# Patient Record
Sex: Male | Born: 1985 | Hispanic: Yes | Marital: Single | Smoking: Current every day smoker
Health system: Southern US, Community
[De-identification: ages and names within clinical notes are randomized; demographics above are authoritative.]

---

## 2010-06-17 ENCOUNTER — Emergency Department (HOSPITAL_COMMUNITY): Admission: EM | Admit: 2010-06-17 | Discharge: 2010-06-17 | Payer: Self-pay | Admitting: Emergency Medicine

## 2012-02-04 ENCOUNTER — Encounter (HOSPITAL_COMMUNITY): Payer: Self-pay | Admitting: Emergency Medicine

## 2012-02-04 ENCOUNTER — Emergency Department (HOSPITAL_COMMUNITY)
Admission: EM | Admit: 2012-02-04 | Discharge: 2012-02-04 | Disposition: A | Discharge status: Home / Self Care | Payer: Self-pay | Attending: Emergency Medicine | Admitting: Emergency Medicine

## 2012-02-04 DIAGNOSIS — H938X9 Other specified disorders of ear, unspecified ear: Secondary | ICD-10-CM | POA: Insufficient documentation

## 2012-02-04 DIAGNOSIS — H9190 Unspecified hearing loss, unspecified ear: Secondary | ICD-10-CM | POA: Insufficient documentation

## 2012-02-04 DIAGNOSIS — H9209 Otalgia, unspecified ear: Secondary | ICD-10-CM | POA: Insufficient documentation

## 2012-02-04 DIAGNOSIS — H9210 Otorrhea, unspecified ear: Secondary | ICD-10-CM | POA: Insufficient documentation

## 2012-02-04 DIAGNOSIS — H6090 Unspecified otitis externa, unspecified ear: Secondary | ICD-10-CM

## 2012-02-04 DIAGNOSIS — H60399 Other infective otitis externa, unspecified ear: Secondary | ICD-10-CM | POA: Insufficient documentation

## 2012-02-04 MED ORDER — NEOMYCIN-POLYMYXIN-HC 3.5-10000-1 OT SUSP
4.0000 [drp] | Freq: Four times a day (QID) | OTIC | Status: DC
  Administered 2012-02-04: 4 [drp] via OTIC
  Filled 2012-02-04: qty 10

## 2012-02-04 NOTE — ED Provider Notes (Signed)
History     CSN: [id number]  Arrival date & time 02/04/12  1478   First MD Initiated Contact with Patient 02/04/12 [phone]      Chief Complaint  Patient presents with  . Ear pain     (Consider location/radiation/quality/duration/timing/severity/associated sxs/prior treatment) HPI Level V caveat: Language barrier. This is a 26 year old Hispanic male with a history of pain in the left ear since yesterday. The pain is moderate to severe, worse with movement of the ear. There is some drainage from the ear. There's some decreased hearing acuity in that ear. He has a history of ear infections as a child but none as an adult.  History reviewed. No pertinent past medical history.  History reviewed. No pertinent past surgical history.  No family history on file.  History  Substance Use Topics  . Smoking status: Current Everyday Smoker    Types: Cigarettes  . Smokeless tobacco: Not on file  . Alcohol Use: No      Review of Systems  Unable to perform ROS   Allergies  Review of patient's allergies indicates no known allergies.  Home Medications  No current outpatient prescriptions on file.  BP 128/79  Pulse 72  Temp(Src) 97.7 F (36.5 C) (Oral)  Resp 16  SpO2 98%  Physical Exam General: Well-developed, well-nourished male in no acute distress; appearance consistent with age of record HENT: normocephalic, atraumatic; no erythema tympanic membranes; edema and exudate in the left external auditory canal; pain on movement of left external ear Eyes: pupils equal round and reactive to light; extraocular muscles intact Neck: supple Heart: regular rate and rhythm Lungs: Normal respiratory effort and excursion Abdomen: soft; nondistended Extremities: No deformity; full range of motion Neurologic: Awake, alert; motor function intact in all extremities and symmetric; no facial droop Skin: Warm and dry    ED Course  Procedures (including critical care time)    MDM           Hanley Seamen, MD 02/04/12 [phone]

## 2012-02-04 NOTE — ED Notes (Signed)
Patient with ear pain on the left since yesterday.

## 2013-11-11 ENCOUNTER — Emergency Department (HOSPITAL_COMMUNITY)
Admission: EM | Admit: 2013-11-11 | Discharge: 2013-11-11 | Disposition: A | Discharge status: Home / Self Care | Payer: Self-pay | Attending: Emergency Medicine | Admitting: Emergency Medicine

## 2013-11-11 ENCOUNTER — Encounter (HOSPITAL_COMMUNITY): Payer: Self-pay | Admitting: Emergency Medicine

## 2013-11-11 DIAGNOSIS — J111 Influenza due to unidentified influenza virus with other respiratory manifestations: Secondary | ICD-10-CM | POA: Insufficient documentation

## 2013-11-11 DIAGNOSIS — R630 Anorexia: Secondary | ICD-10-CM | POA: Insufficient documentation

## 2013-11-11 DIAGNOSIS — R61 Generalized hyperhidrosis: Secondary | ICD-10-CM | POA: Insufficient documentation

## 2013-11-11 DIAGNOSIS — F17200 Nicotine dependence, unspecified, uncomplicated: Secondary | ICD-10-CM | POA: Insufficient documentation

## 2013-11-11 MED ORDER — ACETAMINOPHEN 325 MG PO TABS
650.0000 mg | ORAL_TABLET | Freq: Once | ORAL | Status: AC
  Administered 2013-11-11: 650 mg via ORAL
  Filled 2013-11-11: qty 2

## 2013-11-11 MED ORDER — FLUTICASONE PROPIONATE 50 MCG/ACT NA SUSP: 1.0000 | Freq: Every day | NASAL | Status: AC

## 2013-11-11 NOTE — ED Provider Notes (Signed)
CSN: [id number]     Arrival date & time 11/11/13  1359 History  This chart was scribed for non-physician practitioner Dierdre Forth, PA-C working with Junius Argyle, MD by Joaquin Music, ED Scribe. This patient was seen in room TR09C/TR09C and the patient's care was started at 4:02 PM .   Chief Complaint  Patient presents with  . URI  . Generalized Body Aches   The history is provided by the patient and medical records. No language interpreter was used.   HPI Comments: Larry Camacho is a 27 y.o. male who presents to the Emergency Department complaining of ongoing fever, chills, myalgias and fatigue for the past 2 days. Pt states he when he woke up 2 days ago, he had myalgias and was diaphoretic. Pt states he felt "like he was hit by a bus". Pt states he is aching and in pain throughout his body. He states he has been "very tired" and has a tendency to take frequent naps. He states when he wakes up, his clothes is soaking wet from sweat and states he has to take a shower. Pt states he has had a decreased appetite and states he has only been wanting to drink water. Pt denies HA. Pt denies sore throat, congestion, otalgia, nausea, emesis, and diarrhea.   History reviewed. No pertinent past medical history. History reviewed. No pertinent past surgical history. History reviewed. No pertinent family history. History  Substance Use Topics  . Smoking status: Current Every Day Smoker    Types: Cigarettes  . Smokeless tobacco: Not on file  . Alcohol Use: No    Review of Systems  Constitutional: Positive for fever, chills, appetite change and fatigue. Negative for diaphoresis.  HENT: Positive for congestion and sinus pressure. Negative for ear pain, rhinorrhea, sneezing and sore throat.   Gastrointestinal: Negative for nausea, vomiting and diarrhea.  Musculoskeletal: Positive for myalgias.    Allergies  Review of patient's allergies indicates no known  allergies.  Home Medications  No current outpatient prescriptions on file.  BP 129/77  Temp(Src) 98.2 F (36.8 C) (Oral)  Resp 16  Ht 5\' 5"  (1.651 m)  Wt 147 lb 8 oz (66.906 kg)  BMI 24.55 kg/m2  SpO2 98%  Physical Exam  Nursing note and vitals reviewed. Constitutional: He is oriented to person, place, and time. He appears well-developed and well-nourished. No distress.  Awake, alert, nontoxic appearance  HENT:  Head: Normocephalic and atraumatic.  Right Ear: Tympanic membrane, external ear and ear canal normal.  Left Ear: Tympanic membrane, external ear and ear canal normal.  Nose: Mucosal edema and rhinorrhea present. No epistaxis. Right sinus exhibits no maxillary sinus tenderness and no frontal sinus tenderness. Left sinus exhibits no maxillary sinus tenderness and no frontal sinus tenderness.  Mouth/Throat: Uvula is midline, oropharynx is clear and moist and mucous membranes are normal. Mucous membranes are not pale and not cyanotic. No oropharyngeal exudate, posterior oropharyngeal edema, posterior oropharyngeal erythema or tonsillar abscesses.  Mucous membranes moist  Eyes: Conjunctivae and EOM are normal. Pupils are equal, round, and reactive to light. No scleral icterus.  Neck: Normal range of motion and full passive range of motion without pain. Neck supple. No tracheal deviation present.  Cardiovascular: Normal rate, regular rhythm, normal heart sounds and intact distal pulses.   No murmur heard. Pulmonary/Chest: Effort normal and breath sounds normal. No stridor. No respiratory distress. He has no wheezes.  Clear and equal breath sounds  Abdominal: Soft. Bowel sounds are normal. He exhibits no  mass. There is no tenderness. There is no rebound and no guarding.  Soft and nontender abdomen  Musculoskeletal: Normal range of motion. He exhibits no edema.  Lymphadenopathy:    He has no cervical adenopathy.  Neurological: He is alert and oriented to person, place, and time.  He exhibits normal muscle tone. Coordination normal.  Speech is clear and goal oriented Moves extremities without ataxia  Skin: Skin is warm and dry. No rash noted. He is not diaphoretic.  Psychiatric: He has a normal mood and affect. His behavior is normal.    ED Course  Procedures  DIAGNOSTIC STUDIES: Oxygen Saturation is 98% on RA, normal by my interpretation.    COORDINATION OF CARE: 4:10 PM-Discussed treatment plan which includes administer Tylenol while in ED and D/C pt with Flonase. Pt agreed to plan.   Labs Review Labs Reviewed - No data to display Imaging Review No results found.  EKG Interpretation   None       MDM   1. Influenza-like illness      Larry Camacho presents with influenza like illness vs URI symptoms.  Pt with myalgias, subjective fevers without otalgia, ST cough or wheezing.  Pt without comorbidities which would put him at risk and stable vital signs.   Patient with symptoms consistent with influenza like illness.  Vitals are stable, low-grade fever.  No signs of dehydration, tolerating PO's.  Lungs are clear. Due to patient's presentation and physical exam a chest x-ray was not ordered bc likely diagnosis of flu.  Discussed the cost versus benefit of Tamiflu treatment with the patient.  The patient understands that symptoms are greater than the recommended 24-48 hour window of treatment.  Patient will be discharged with instructions to orally hydrate, rest, and use over-the-counter medications such as anti-inflammatories ibuprofen and Aleve for muscle aches and Tylenol for fever.    It has been determined that no acute conditions requiring further emergency intervention are present at this time. The patient/guardian have been advised of the diagnosis and plan. We have discussed signs and symptoms that warrant return to the ED, such as changes or worsening in symptoms.   Vital signs are stable at discharge.   BP 129/77  Temp(Src) 98.2 F (36.8  C) (Oral)  Resp 16  Ht 5\' 5"  (1.651 m)  Wt 147 lb 8 oz (66.906 kg)  BMI 24.55 kg/m2  SpO2 98%  Patient/guardian has voiced understanding and agreed to follow-up with the PCP or specialist.    I personally performed the services described in this documentation, which was scribed in my presence. The recorded information has been reviewed and is accurate.    Dahlia Client Vicie Cech, PA-C 11/11/13 1826

## 2013-11-11 NOTE — ED Provider Notes (Signed)
Medical screening examination/treatment/procedure(s) were performed by non-physician practitioner and as supervising physician I was immediately available for consultation/collaboration.  EKG Interpretation   None         Junius Argyle, MD 11/11/13 2021

## 2013-11-11 NOTE — ED Notes (Signed)
Pt c/o URI sx and body aches x 2 days; pt unsure if had fever; pt requests note for work today

## 2015-08-01 ENCOUNTER — Emergency Department (HOSPITAL_COMMUNITY)
Admission: EM | Admit: 2015-08-01 | Discharge: 2015-08-01 | Disposition: A | Discharge status: Home / Self Care | Payer: Self-pay | Attending: Emergency Medicine | Admitting: Emergency Medicine

## 2015-08-01 ENCOUNTER — Encounter (HOSPITAL_COMMUNITY): Payer: Self-pay

## 2015-08-01 ENCOUNTER — Emergency Department (HOSPITAL_COMMUNITY): Payer: Self-pay

## 2015-08-01 DIAGNOSIS — Z720 Tobacco use: Secondary | ICD-10-CM | POA: Insufficient documentation

## 2015-08-01 DIAGNOSIS — R197 Diarrhea, unspecified: Secondary | ICD-10-CM | POA: Insufficient documentation

## 2015-08-01 DIAGNOSIS — N12 Tubulo-interstitial nephritis, not specified as acute or chronic: Secondary | ICD-10-CM | POA: Insufficient documentation

## 2015-08-01 DIAGNOSIS — N10 Acute tubulo-interstitial nephritis: Secondary | ICD-10-CM

## 2015-08-01 DIAGNOSIS — Z7951 Long term (current) use of inhaled steroids: Secondary | ICD-10-CM | POA: Insufficient documentation

## 2015-08-01 LAB — CBC
HEMATOCRIT: 45.7 % (ref 39.0–52.0)
Hemoglobin: 15.4 g/dL (ref 13.0–17.0)
MCH: 28.4 pg (ref 26.0–34.0)
MCHC: 33.7 g/dL (ref 30.0–36.0)
MCV: 84.2 fL (ref 78.0–100.0)
Platelets: 235 10*3/uL (ref 150–400)
RBC: 5.43 MIL/uL (ref 4.22–5.81)
RDW: 12.7 % (ref 11.5–15.5)
WBC: 4.7 10*3/uL (ref 4.0–10.5)

## 2015-08-01 LAB — URINALYSIS, ROUTINE W REFLEX MICROSCOPIC
Bilirubin Urine: NEGATIVE
Glucose, UA: NEGATIVE mg/dL
HGB URINE DIPSTICK: NEGATIVE
Ketones, ur: NEGATIVE mg/dL
NITRITE: NEGATIVE
Protein, ur: NEGATIVE mg/dL
Specific Gravity, Urine: 1.02 (ref 1.005–1.030)
UROBILINOGEN UA: 1 mg/dL (ref 0.0–1.0)
pH: 7 (ref 5.0–8.0)

## 2015-08-01 LAB — COMPREHENSIVE METABOLIC PANEL
ALBUMIN: 4.1 g/dL (ref 3.5–5.0)
ALK PHOS: 105 U/L (ref 38–126)
ALT: 17 U/L (ref 17–63)
ANION GAP: 5 (ref 5–15)
AST: 23 U/L (ref 15–41)
BUN: 14 mg/dL (ref 6–20)
CALCIUM: 9 mg/dL (ref 8.9–10.3)
CHLORIDE: 104 mmol/L (ref 101–111)
CO2: 27 mmol/L (ref 22–32)
CREATININE: 1.12 mg/dL (ref 0.61–1.24)
GFR calc Af Amer: >60 mL/min (ref >60)
GLUCOSE: 101 mg/dL — AB (ref 65–99)
Potassium: 3.6 mmol/L (ref 3.5–5.1)
Sodium: 136 mmol/L (ref 135–145)
TOTAL PROTEIN: 7.2 g/dL (ref 6.5–8.1)
Total Bilirubin: 0.8 mg/dL (ref 0.3–1.2)

## 2015-08-01 LAB — URINE MICROSCOPIC-ADD ON

## 2015-08-01 LAB — LIPASE, BLOOD: LIPASE: 33 U/L (ref 22–51)

## 2015-08-01 MED ORDER — METOCLOPRAMIDE HCL 10 MG PO TABS
10.0000 mg | ORAL_TABLET | Freq: Four times a day (QID) | ORAL | Status: AC | PRN
Start: 2015-08-01 — End: ?

## 2015-08-01 MED ORDER — IOHEXOL 300 MG/ML  SOLN
25.0000 mL | INTRAMUSCULAR | Status: AC
  Administered 2015-08-01: 25 mL via ORAL

## 2015-08-01 MED ORDER — IOHEXOL 300 MG/ML  SOLN: 25.0000 mL | INTRAMUSCULAR | Status: DC

## 2015-08-01 MED ORDER — CEPHALEXIN 500 MG PO CAPS: 500.0000 mg | ORAL_CAPSULE | Freq: Four times a day (QID) | ORAL | Status: AC

## 2015-08-01 MED ORDER — IOHEXOL 300 MG/ML  SOLN
100.0000 mL | Freq: Once | INTRAMUSCULAR | Status: AC | PRN
  Administered 2015-08-01: 100 mL via INTRAVENOUS

## 2015-08-01 MED ORDER — CEPHALEXIN 250 MG PO CAPS
500.0000 mg | ORAL_CAPSULE | Freq: Once | ORAL | Status: AC
Start: 2015-08-01 — End: 2015-08-01
  Administered 2015-08-01: 500 mg via ORAL
  Filled 2015-08-01: qty 2

## 2015-08-01 MED ORDER — SODIUM CHLORIDE 0.9 % IV BOLUS (SEPSIS)
1000.0000 mL | Freq: Once | INTRAVENOUS | Status: AC
  Administered 2015-08-01: 1000 mL via INTRAVENOUS

## 2015-08-01 NOTE — ED Notes (Signed)
Pt c/o abdominal pain, n/v/d since yesterday. 3 episodes vomiting, 2 episodes diarrhea. Pt thinks he may have eaten something that upset his stomach.

## 2015-08-01 NOTE — ED Provider Notes (Signed)
CSN: [id number]     Arrival date & time 08/01/15  7829 History   First MD Initiated Contact with Patient 08/01/15 0708    History is obtained from medical interpreter using [place] language line and instructions given via medical interpreter Chief Complaint  Patient presents with  . Abdominal Pain     (Consider location/radiation/quality/duration/timing/severity/associated sxs/prior Treatment) HPI Complains of abdominal pain onset 6 PM yesterday .pain is lower abdomen nonradiating. Symptoms accompanied by 3 episodes of vomiting last night and one episode this morning. Also had 3 episodes of diarrhea since onset of pain. No urinary symptoms. No fever. No other associated symptoms. No treatment prior to coming here. Nothing makes pain better or worse. Pain is moderate at present. History reviewed. No pertinent past medical history.   past medical history negative History reviewed. No pertinent past surgical history. past surgical history nasal surgery History reviewed. No pertinent family history. History  Substance Use Topics  . Smoking status: Current Every Day Smoker    Types: Cigarettes  . Smokeless tobacco: Not on file  . Alcohol Use: No   occasional alcohol use,, marijuana use  Review of Systems  Constitutional: Negative.   HENT: Negative.   Respiratory: Negative.   Cardiovascular: Negative.   Gastrointestinal: Positive for abdominal pain. Negative for vomiting, diarrhea and blood in stool.       No hematemesis  Musculoskeletal: Negative.   Skin: Negative.   Neurological: Negative.   Psychiatric/Behavioral: Negative.   All other systems reviewed and are negative.     Allergies  Review of patient's allergies indicates no known allergies.  Home Medications   Prior to Admission medications   Medication Sig Start Date End Date Taking? Authorizing Provider  fluticasone (FLONASE) 50 MCG/ACT nasal spray Place 1 spray into both nostrils daily. 11/11/13   Hannah Muthersbaugh,  PA-C   BP 160/93 mmHg  Pulse 94  Temp(Src) 98.2 F (36.8 C)  Resp 16  Ht  (1.676 m)  Wt 150 lb (68.04 kg)  BMI 24.22 kg/m2  SpO2 99% Physical Exam  Constitutional: He appears well-developed and well-nourished. No distress.  HENT:  Head: Normocephalic and atraumatic.  Eyes: Conjunctivae are normal. Pupils are equal, round, and reactive to light.  Neck: Neck supple. No tracheal deviation present. No thyromegaly present.  Cardiovascular: Normal rate and regular rhythm.   No murmur heard. Pulmonary/Chest: Effort normal and breath sounds normal.  Abdominal: Soft. Bowel sounds are normal. He exhibits no distension and no mass. There is tenderness. There is no rebound and no guarding.  Periumbilical tenderness, right lower quadrant tenderness  Genitourinary: Penis normal.  Scrotum normal  Musculoskeletal: Normal range of motion. He exhibits no edema or tenderness.  Neurological: He is alert. Coordination normal.  Skin: Skin is warm and dry. No rash noted.  Psychiatric: He has a normal mood and affect.  Nursing note and vitals reviewed.   ED Course  Procedures (including critical care time) Labs Review Labs Reviewed  CBC  LIPASE, BLOOD  COMPREHENSIVE METABOLIC PANEL  URINALYSIS, ROUTINE W REFLEX MICROSCOPIC (NOT AT [facility])    Imaging Review No results found.   EKG Interpretation None     declines pain medicine 10:45 AM feels improved after treatment with intravenous fluids. No nausea presently. Abdominal pain is minimal. Clotting pain medicine Results for orders placed or performed during the hospital encounter of 08/01/15  Lipase, blood  Result Value Ref Range   Lipase 33 22 - 51 U/L  Comprehensive metabolic panel  Result Value Ref  Range   Sodium 136 135 - 145 mmol/L   Potassium 3.6 3.5 - 5.1 mmol/L   Chloride 104 101 - 111 mmol/L   CO2 27 22 - 32 mmol/L   Glucose, Bld 101 (H) 65 - 99 mg/dL   BUN 14 6 - 20 mg/dL   Creatinine, Ser 1.61 0.61 - 1.24 mg/dL    Calcium 9.0 8.9 - 09.6 mg/dL   Total Protein 7.2 6.5 - 8.1 g/dL   Albumin 4.1 3.5 - 5.0 g/dL   AST 23 15 - 41 U/L   ALT 17 17 - 63 U/L   Alkaline Phosphatase 105 38 - 126 U/L   Total Bilirubin 0.8 0.3 - 1.2 mg/dL   GFR calc non Af Amer >60 >60 mL/min   GFR calc Af Amer >60 >60 mL/min   Anion gap 5 5 - 15  CBC  Result Value Ref Range   WBC 4.7 4.0 - 10.5 K/uL   RBC 5.43 4.22 - 5.81 MIL/uL   Hemoglobin 15.4 13.0 - 17.0 g/dL   HCT 04.5 40.9 - 81.1 %   MCV 84.2 78.0 - 100.0 fL   MCH 28.4 26.0 - 34.0 pg   MCHC 33.7 30.0 - 36.0 g/dL   RDW 91.4 78.2 - 95.6 %   Platelets 235 150 - 400 K/uL  Urinalysis, Routine w reflex microscopic (not at [facility])  Result Value Ref Range   Color, Urine YELLOW YELLOW   APPearance CLEAR CLEAR   Specific Gravity, Urine 1.020 1.005 - 1.030   pH 7.0 5.0 - 8.0   Glucose, UA NEGATIVE NEGATIVE mg/dL   Hgb urine dipstick NEGATIVE NEGATIVE   Bilirubin Urine NEGATIVE NEGATIVE   Ketones, ur NEGATIVE NEGATIVE mg/dL   Protein, ur NEGATIVE NEGATIVE mg/dL   Urobilinogen, UA 1.0 0.0 - 1.0 mg/dL   Nitrite NEGATIVE NEGATIVE   Leukocytes, UA MODERATE (A) NEGATIVE  Urine microscopic-add on  Result Value Ref Range   Squamous Epithelial / LPF RARE RARE   WBC, UA TOO NUMEROUS TO COUNT <3 WBC/hpf   RBC / HPF 0-2 <3 RBC/hpf   Bacteria, UA FEW (A) RARE   Ct Abdomen Pelvis W Contrast  08/01/2015   CLINICAL DATA:  One day history of right lower quadrant pain with nausea and vomiting  EXAM: CT ABDOMEN AND PELVIS WITH CONTRAST  TECHNIQUE: Multidetector CT imaging of the abdomen and pelvis was performed using the standard protocol following bolus administration of intravenous contrast. Oral contrast was also administered.  CONTRAST:  OMNIPAQUE IOHEXOL 300 MG/ML  SOLN  COMPARISON:  None.  FINDINGS: Lung bases are clear.  No focal liver lesions are identified. There is a small gallstone in the dependent portion of the gallbladder. The gallbladder wall is not thickened. There is  no biliary duct dilatation.  Spleen, pancreas, and adrenals appear normal.  There is a 1 mm calculus in the mid right kidney. There is a 1 mm calculus with a nearby 3 mm calculus in the lower pole left kidney. There is a tiny cyst in the mid right kidney. There is slight fullness the left renal collecting system. There is no fullness of the right collecting system. Renal enhancement bilaterally is within normal limits. There is no perinephric stranding or thickening on either side. There are no ureteral calculi on either side.  In the pelvis, the urinary bladder is slightly distended with normal wall thickness. There is no pelvic mass or pelvic fluid collection. There are several small calcifications in the prostate.  The  appendix region appears normal. There is no periappendiceal inflammation.  There is no bowel obstruction. There is no appreciable bowel wall or mesenteric thickening. No free air or portal venous air.  There is no appreciable ascites, adenopathy, or abscess in the abdomen or pelvis. There is no appreciable abdominal aortic aneurysm. There is a tiny lipoma in the right iliac crest just lateral to the sacroiliac joint. There are no blastic or lytic bone lesions.  IMPRESSION: There is no bowel obstruction or abscess. The appendix appears normal.  There are small nonobstructing calculi in each kidney. There is slight fullness of the left renal collecting system without obstructing lesions seen. This finding potentially could represent recent calculus passage on the left. This finding also could represent early pyelonephritis on the left. There is no associated perinephric fluid or stranding. Renal enhancement bilaterally appear symmetric. Note that there is no hydronephrosis or ureteral calculus on the right. Note that it is also possible that the mild fullness the left renal collecting system is due to relative distention of the urinary bladder.  Small gallstone and dependent portion of gallbladder.  Gallbladder wall does not appear appreciably thickened by CT.   Electronically Signed   By: Bretta Bang III M.D.   On: 08/01/2015 08:43    MDM   Final diagnoses:  None   Pt can be treated as outpatient, not ill appearing , no further nausea or vomiting. Plan rx keflex, reglan. Urine for cand s  Referral [place] [facility]  Dx acute pyelonephritis    Doug Sou, MD 08/01/15 1053

## 2015-08-01 NOTE — ED Notes (Signed)
CT aware pt finished contrast 

## 2015-08-01 NOTE — Discharge Instructions (Signed)
Pielonefritis - Adultos  (Pyelonephritis, Adult) Call the  and community [facility] Center to arrange to be seen within the next one or 2 weeks. Return if you're unable to hold down the medications prescribed without vomiting or if you feel worse for any reason.  La pielonefritis es una infeccin del rin. Hay dos tipos principales de pielonefritis:   Una infeccin que se inicia rpidamente sin sntomas previos (pielonefritis [place]).  Infecciones que persisten por un largo perodo (pielonefritis crnica). CAUSAS  Hay dos causas principales:   Pasaje de bacterias desde la vejiga al rin. Este problema aparece especialmente en mujeres embarazadas. La orina en la vejiga puede infectarse por diferentes causas, por ejemplo:  Inflamacin de la prstata (prostatitis).  Durante las [employer].  Infeccin en la vejiga (cistitis).  Pasaje de bacterias desde la sangre hacia el rin. Las enfermedades que aumentan el riesgo son:   Diabetes.  Clculos renales o en la vescula.  Cncer.  Un catter colocado en la vejiga.  Otras anormalidades del rin o de Engineer, mining. SNTOMAS   Dolor abdominal  Dolor en la zona del costado o flanco.  Grant Ruts.  Escalofros.  Malestar estomacal.  Sangre en la orina Larose Kells).  Necesidad frecuente de orinar  Necesidad intensa o persistente de Geographical information systems officer.  Sensacin de ardor o pinchazos al [employer]. DIAGNSTICO  El mdico diagnosticar una infeccin en su rin basndose en los sntomas. Tambin tomar [place].  TRATAMIENTO  Generalmente el tratamiento depende de la gravedad de la infeccin.   Si la infeccin es leve y se diagnostica a tiempo, el mdico lo tratar con antibiticos por va oral y lo dejar irse a su casa.  Si la infeccin es ms grave, la bacteria podra haber ingresado al torrente sanguneo. Esto requerir antibiticos por va intravenosa y Administrator, arts en el hospital. Los  sntomas pueden incluir:  Fiebre alta.  Dolor intenso en un costado del cuerpo.  Escalofros  An despus de Financial risk analyst hospital, el mdico podr indicarle antibiticos por va oral durante cierto perodo de [place].  Podr prescribirle otros tratamientos segn la causa de la infeccin. INSTRUCCIONES PARA EL CUIDADO EN EL HOGAR   Tome los antibiticos como se le indic. Tmelos todos, aunque se sienta mejor.  Concurra para Education officer, environmental un control y asegurarse de que la infeccin ha desaparecido.  Debe ingerir gran cantidad de lquido para mantener la orina de tono claro o color amarillo plido.  Tome medicamentos para la vejiga si siente urgencia para Geographical information systems officer o lo hace con mucha frecuencia. SOLICITE ATENCIN MDICA DE INMEDIATO SI:   Tiene fiebre o sntomas persistentes durante ms de 2  3 das.  Tiene fiebre y los sntomas [address].  No puede tomar los antibiticos ni ingerir lquidos.  Comienza a sentir escalofros.  Siente debilidad extrema o se desmaya.  No mejora despus de 2 das de [place]. ASEGRESE DE QUE:   Comprende estas instrucciones.  Controlar su enfermedad.  Solicitar ayuda de inmediato si no mejora o empeora. Document Released: 09/26/2005 Document Revised: 06/17/2012 [facility] Patient Information 2015 [place], [place]. This information is not intended to replace advice given to you by your health care provider. Make sure you discuss any questions you have with your health care provider.

## 2015-08-02 LAB — URINE CULTURE
Culture: 3000
SPECIAL REQUESTS: NORMAL

## 2016-12-15 IMAGING — CT CT ABD-PELV W/ CM
2 of 4 series · 15 of 46 positions shown, 17 images · IV contrast (Omni 300)
Comparison: None.

CLINICAL DATA: One day history of right lower quadrant pain with
nausea and vomiting

EXAM:
CT ABDOMEN AND PELVIS WITH CONTRAST
TECHNIQUE: Multidetector CT imaging of the abdomen and pelvis was performed
using the standard protocol following bolus administration of
intravenous contrast. Oral contrast was also administered.
CONTRAST:  100mL OMNIPAQUE IOHEXOL 300 MG/ML  SOLN

[Series 2: abd/ pelvis 5.0 i30f 1 · axial · 0.73mm/px · z∈[+910,+1305]mm · 12 of 87 slices shown, 14 images]
[im 4/87  soft-tissue]
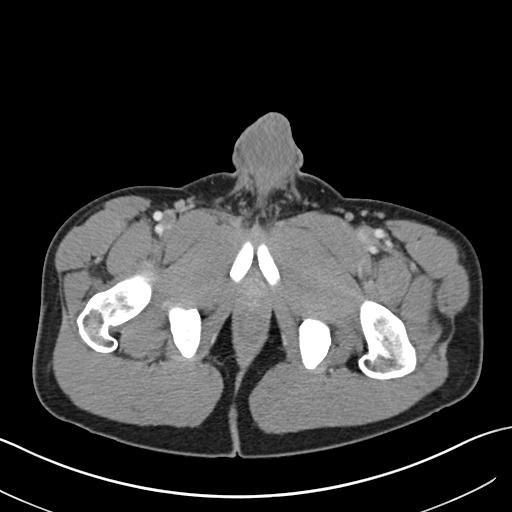
[im 4/87  bone]
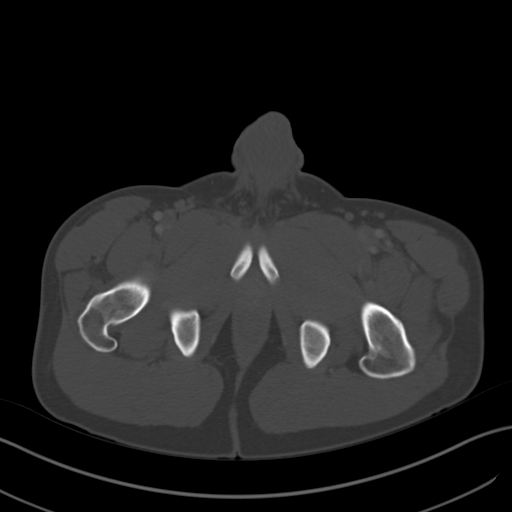
[im 11/87  soft-tissue]
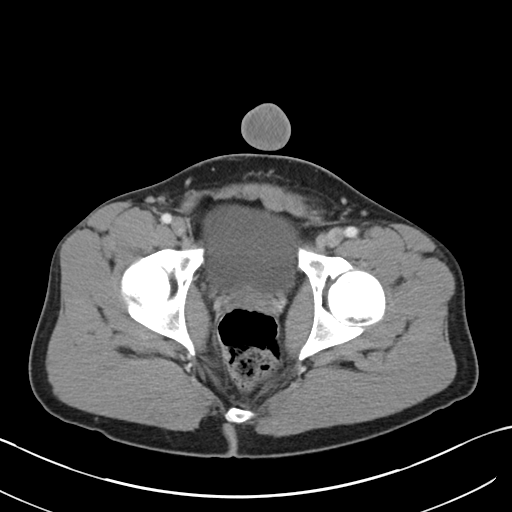
[im 18/87  soft-tissue]
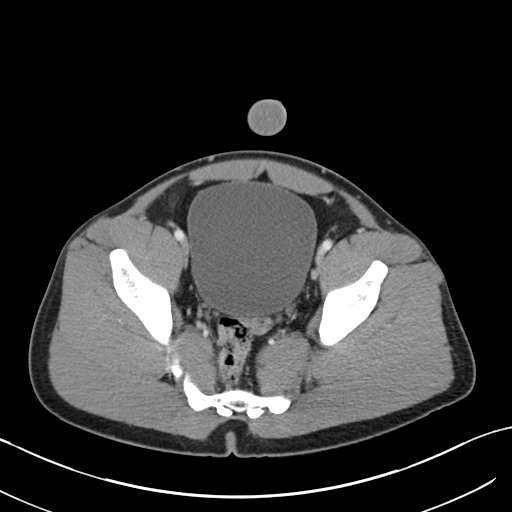
[im 26/87  soft-tissue]
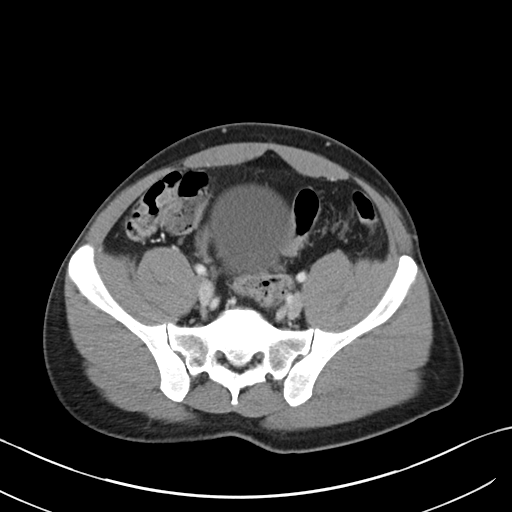
[im 33/87  soft-tissue]
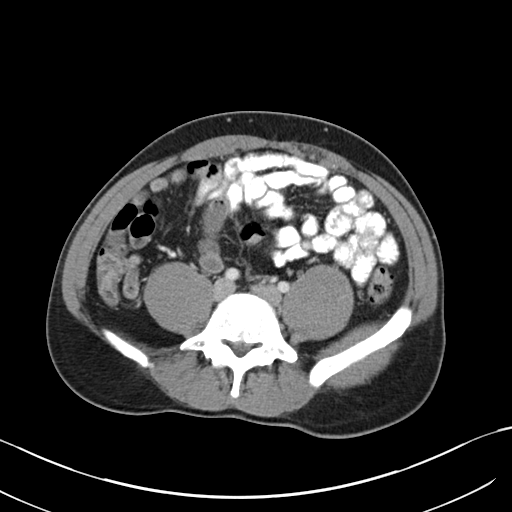
[im 40/87  soft-tissue]
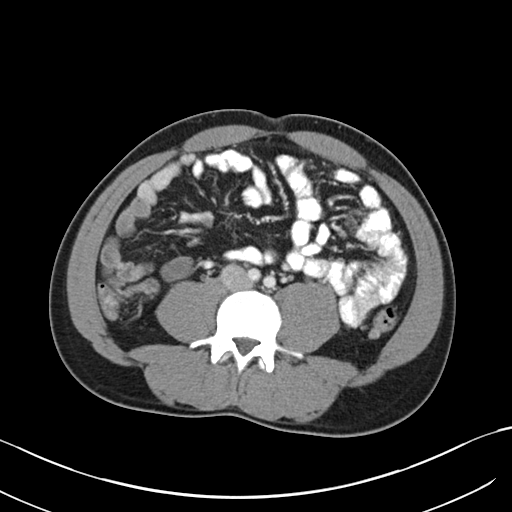
[im 47/87  soft-tissue]
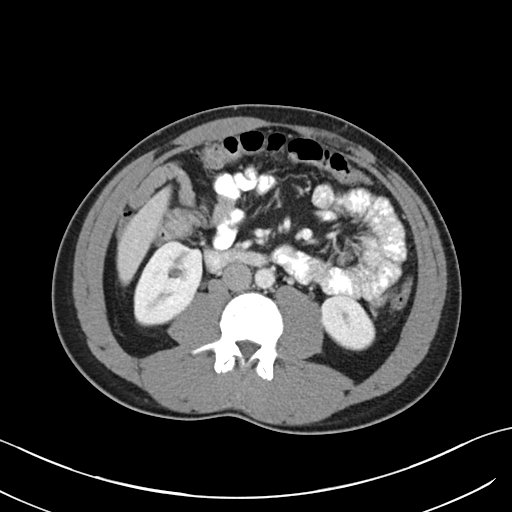
[im 54/87  soft-tissue]
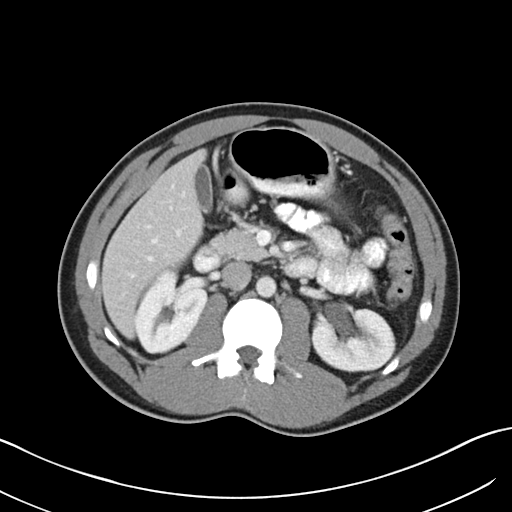
[im 61/87  soft-tissue]
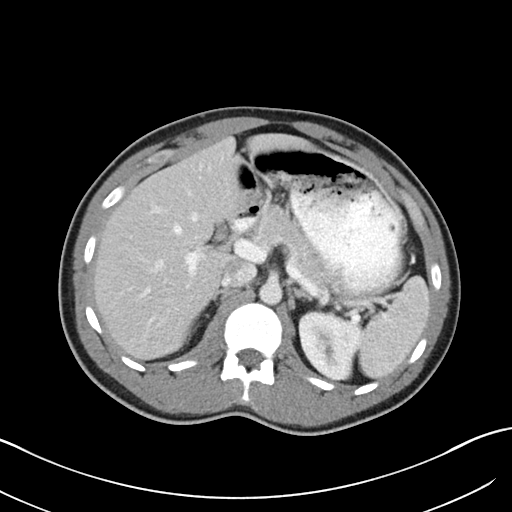
[im 61/87  bone]
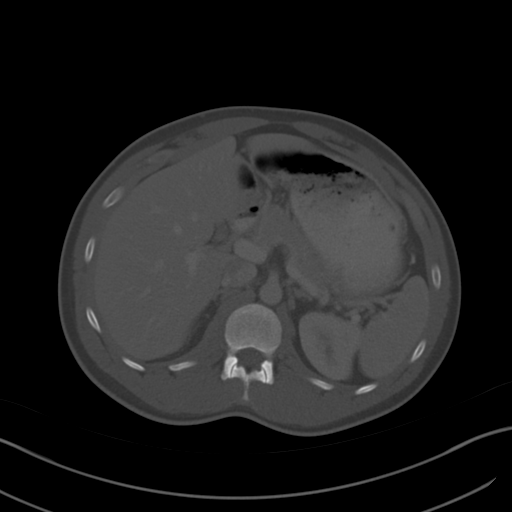
[im 69/87  soft-tissue]
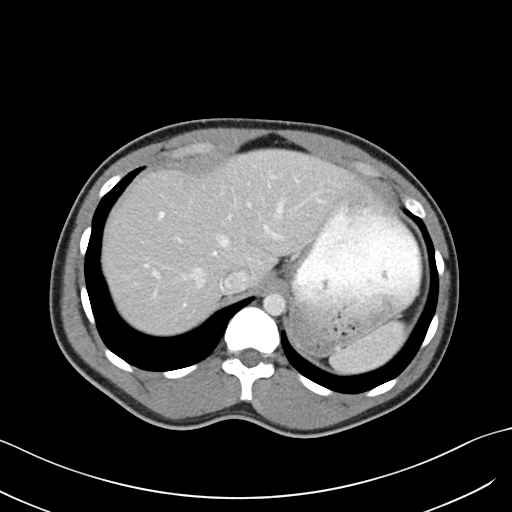
[im 76/87  soft-tissue]
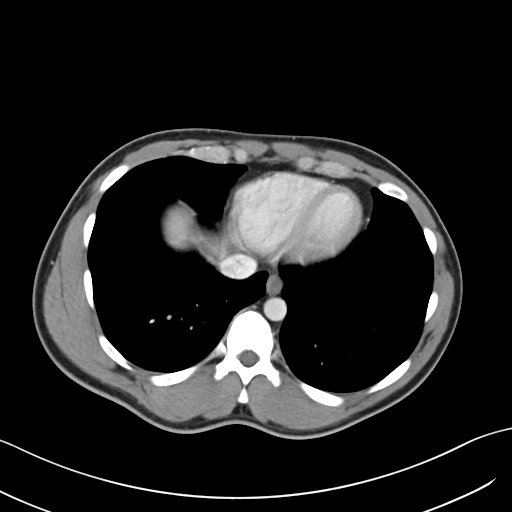
[im 83/87  soft-tissue]
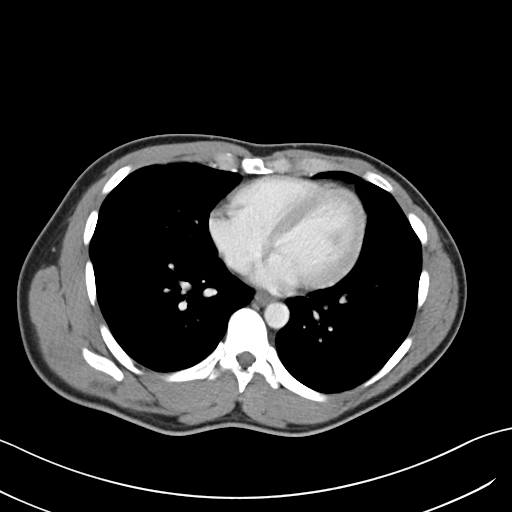

[Series 5: coronals · coronal · 0.70mm/px · 3 of 114 slices shown]
[im 38/114  soft-tissue]
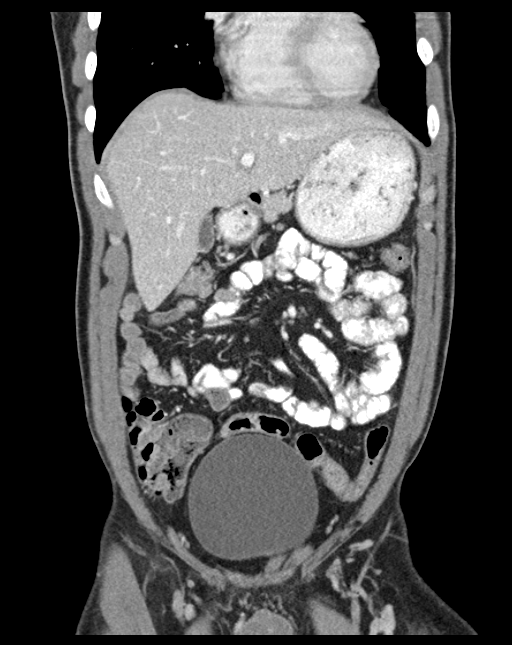
[im 51/114  soft-tissue]
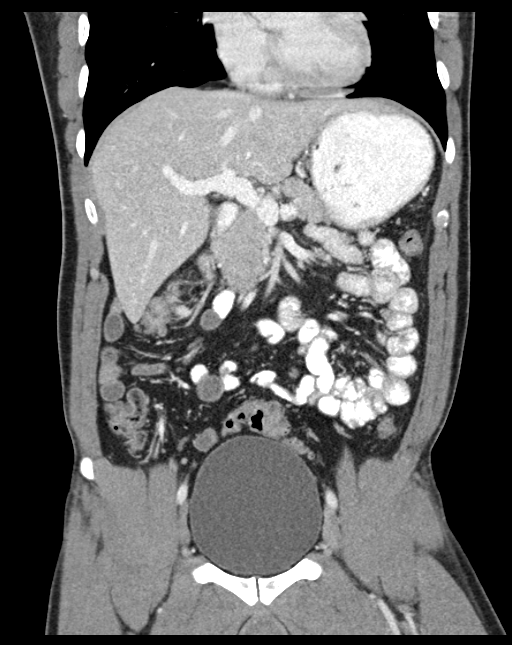
[im 63/114  soft-tissue]
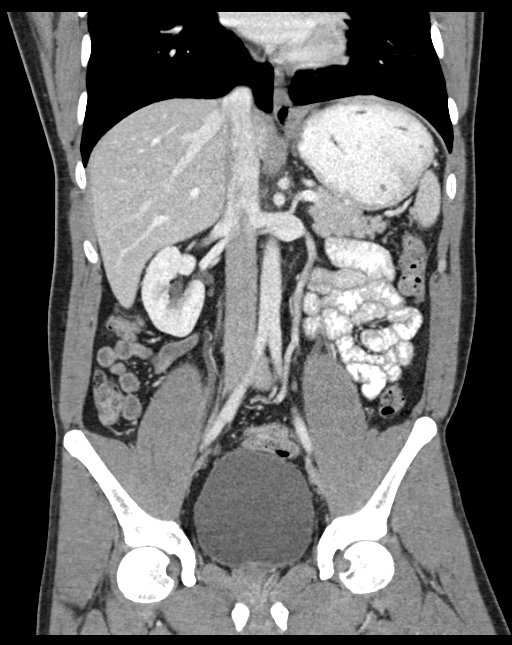

[15 of 46 positions shown; findings below may reference images not displayed]

FINDINGS: Lung bases are clear.

No focal liver lesions are identified. There is a small gallstone in
the dependent portion of the gallbladder. The gallbladder wall is
not thickened. There is no biliary duct dilatation.

Spleen, pancreas, and adrenals appear normal.

There is a 1 mm calculus in the mid right kidney. There is a 1 mm
calculus with a nearby 3 mm calculus in the lower pole left kidney.
There is a tiny cyst in the mid right kidney. There is slight
fullness the left renal collecting system. There is no fullness of
the right collecting system. Renal enhancement bilaterally is within
normal limits. There is no perinephric stranding or thickening on
either side. There are no ureteral calculi on either side.

In the pelvis, the urinary bladder is slightly distended with normal
wall thickness. There is no pelvic mass or pelvic fluid collection.
There are several small calcifications in the prostate.

The appendix region appears normal. There is no periappendiceal
inflammation.

There is no bowel obstruction. There is no appreciable bowel wall or
mesenteric thickening. No free air or portal venous air.

There is no appreciable ascites, adenopathy, or abscess in the
abdomen or pelvis. There is no appreciable abdominal aortic
aneurysm. There is a tiny lipoma in the right iliac crest just
lateral to the sacroiliac joint. There are no blastic or lytic bone
lesions.
IMPRESSION: There is no bowel obstruction or abscess. The appendix appears
normal.

There are small nonobstructing calculi in each kidney. There is
slight fullness of the left renal collecting system without
obstructing lesions seen. This finding potentially could represent
recent calculus passage on the left. This finding also could
represent early pyelonephritis on the left. There is no associated
perinephric fluid or stranding. Renal enhancement bilaterally appear
symmetric. Note that there is no hydronephrosis or ureteral calculus
on the right. Note that it is also possible that the mild fullness
the left renal collecting system is due to relative distention of
the urinary bladder.

Small gallstone and dependent portion of gallbladder. Gallbladder
wall does not appear appreciably thickened by CT.
